# Patient Record
Sex: Male | Born: 1965 | Race: White | Hispanic: No | Smoking: Never smoker
Health system: Southern US, Community
[De-identification: ages and names within clinical notes are randomized; demographics above are authoritative.]

## PROBLEM LIST (undated history)

## (undated) DIAGNOSIS — M109 Gout, unspecified: Secondary | ICD-10-CM

## (undated) DIAGNOSIS — I4891 Unspecified atrial fibrillation: Secondary | ICD-10-CM

## (undated) DIAGNOSIS — I10 Essential (primary) hypertension: Secondary | ICD-10-CM

## (undated) HISTORY — DX: Essential (primary) hypertension: I10

## (undated) HISTORY — PX: APPENDECTOMY: SHX54

## (undated) HISTORY — DX: Unspecified atrial fibrillation: I48.91

## (undated) HISTORY — PX: HERNIA REPAIR: SHX51

---

## 2004-07-06 ENCOUNTER — Emergency Department (HOSPITAL_COMMUNITY): Admission: EM | Admit: 2004-07-06 | Discharge: 2004-07-06 | Payer: Self-pay | Admitting: Emergency Medicine

## 2004-07-16 ENCOUNTER — Ambulatory Visit (HOSPITAL_COMMUNITY): Admission: RE | Admit: 2004-07-16 | Discharge: 2004-07-16 | Payer: Self-pay

## 2005-08-21 IMAGING — CR DG CHEST 2V
2 series · 2 of 2 positions shown · non-contrast
Comparison: none

CLINICAL DATA: Preop respiratory exam for umbilical hernia surgery. 
 CHEST (TWO VIEWS) 07/13/04 
 The heart size and mediastinal contours are normal. The lungs are clear. The visualized skeleton is unremarkable.

 IMPRESSION
 No active disease.

[view not recorded (1 of 2)]
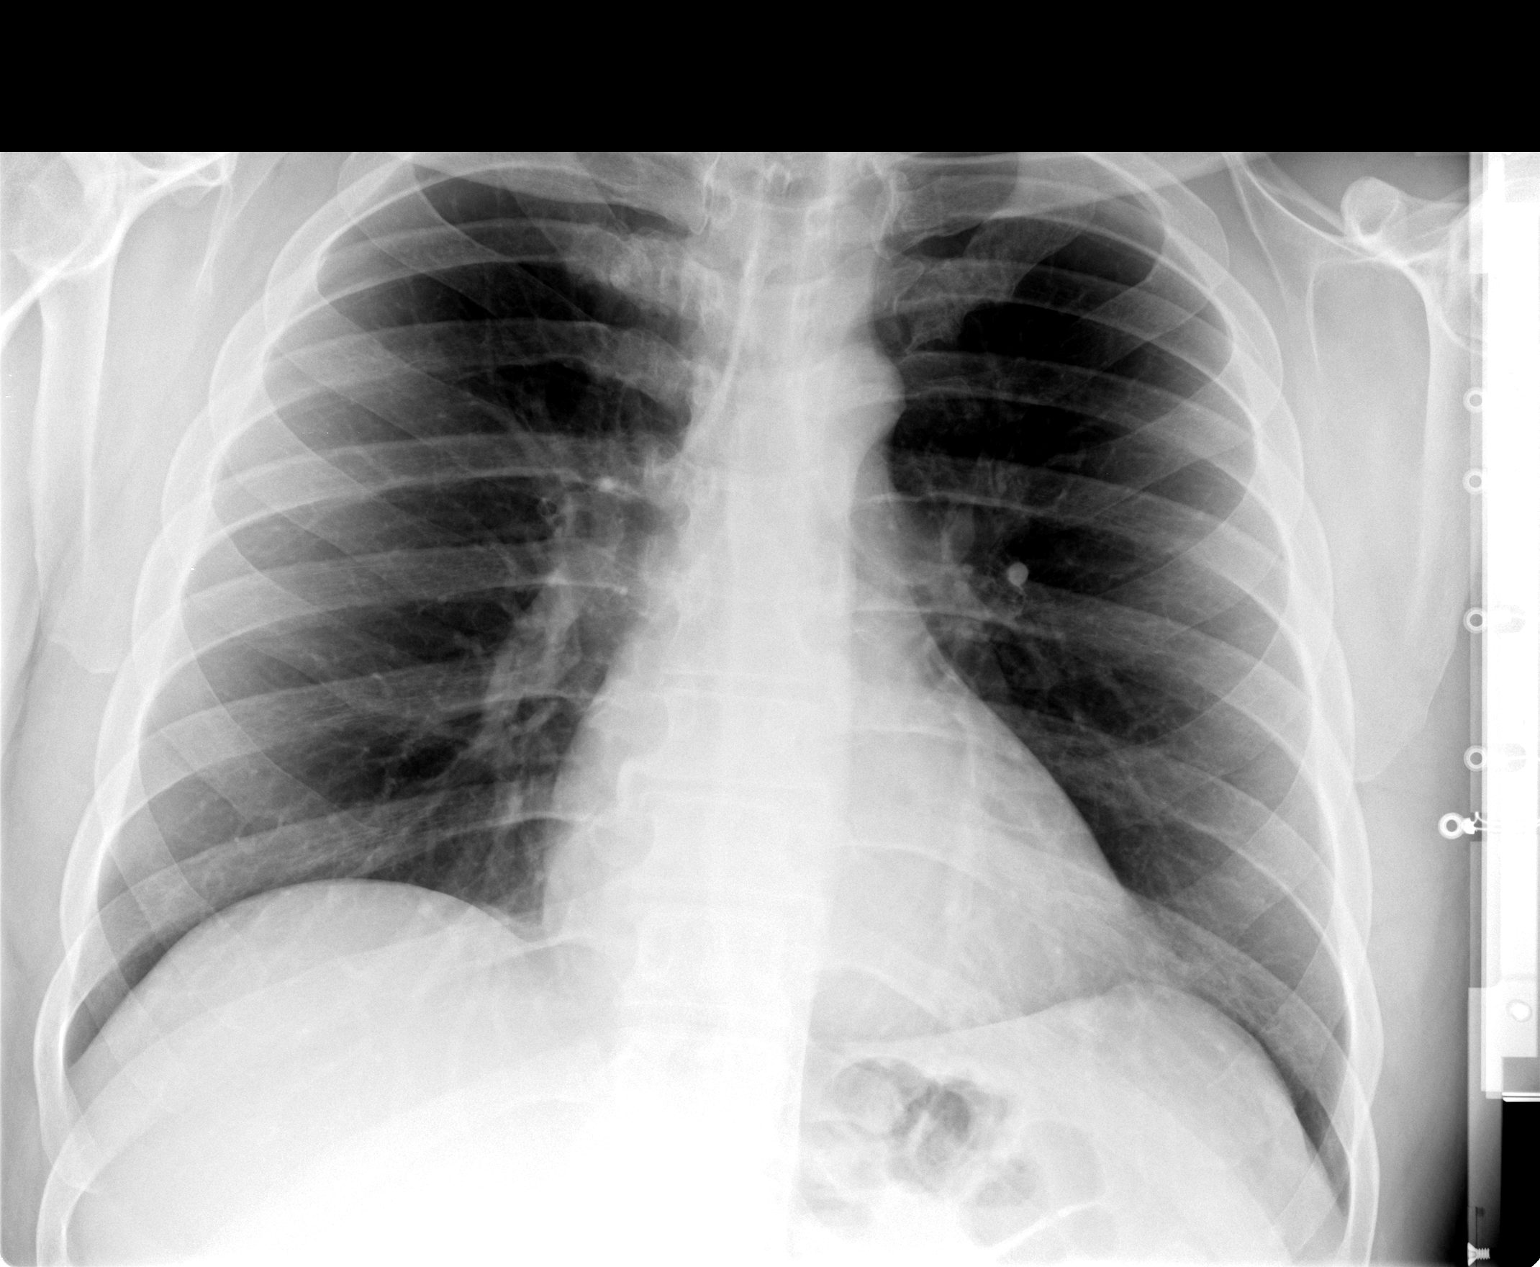

[view not recorded (2 of 2)]
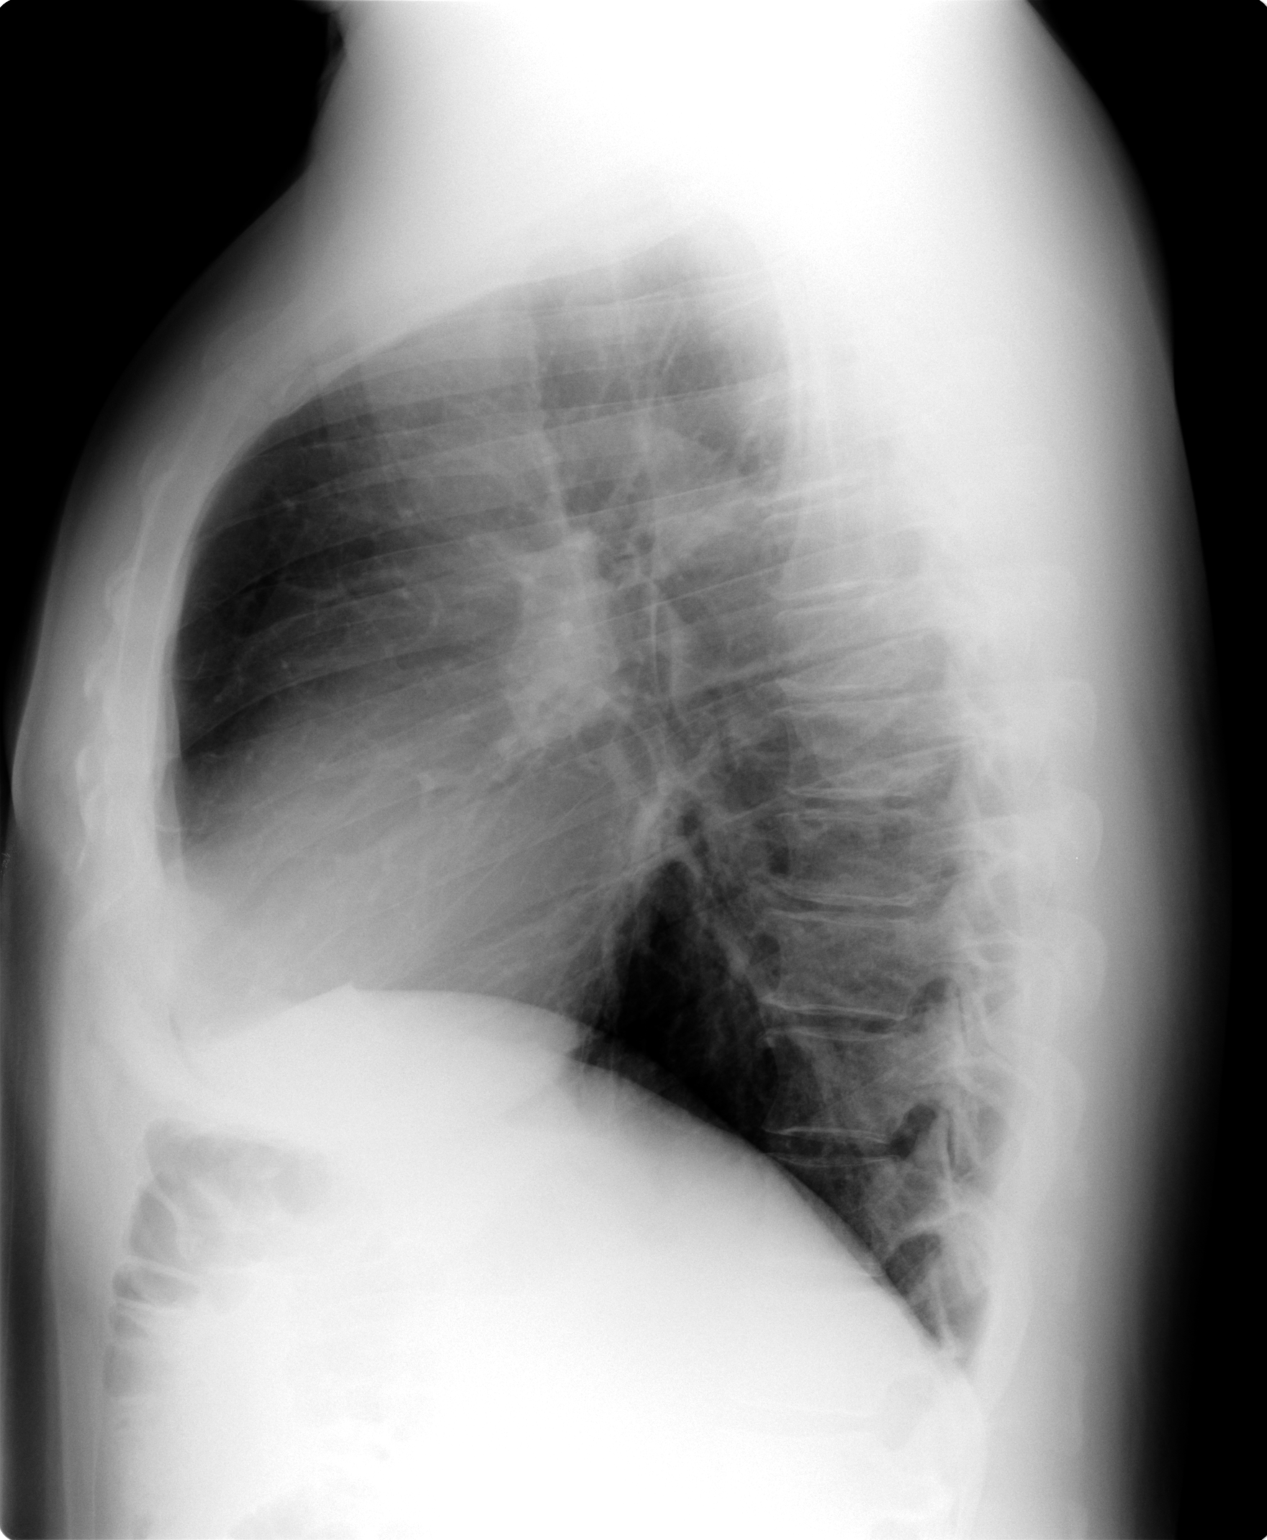

[2 of 2 positions shown; findings below may reference images not displayed]

## 2005-12-24 ENCOUNTER — Ambulatory Visit (HOSPITAL_COMMUNITY): Admission: RE | Admit: 2005-12-24 | Discharge: 2005-12-24 | Payer: Self-pay | Admitting: Orthopedic Surgery

## 2006-02-11 ENCOUNTER — Ambulatory Visit (HOSPITAL_BASED_OUTPATIENT_CLINIC_OR_DEPARTMENT_OTHER): Admission: RE | Admit: 2006-02-11 | Discharge: 2006-02-11 | Payer: Self-pay | Admitting: Orthopedic Surgery

## 2016-12-06 ENCOUNTER — Ambulatory Visit (INDEPENDENT_AMBULATORY_CARE_PROVIDER_SITE_OTHER): Payer: Self-pay | Admitting: Physician Assistant

## 2016-12-06 VITALS — BP 132/88 | HR 102 | Temp 98.7°F | Resp 16 | Ht 72.0 in | Wt 298.0 lb

## 2016-12-06 DIAGNOSIS — M109 Gout, unspecified: Secondary | ICD-10-CM | POA: Insufficient documentation

## 2016-12-06 DIAGNOSIS — M25561 Pain in right knee: Secondary | ICD-10-CM

## 2016-12-06 MED ORDER — INDOMETHACIN 50 MG PO CAPS: ORAL_CAPSULE | ORAL | 1 refills | Status: DC

## 2016-12-06 MED ORDER — ALLOPURINOL 300 MG PO TABS: 300.0000 mg | ORAL_TABLET | Freq: Every day | ORAL | 3 refills | Status: AC

## 2016-12-06 NOTE — Patient Instructions (Addendum)
Use the cane to help take some weight off the RIGHT knee. If you borrow crutches, that would help even more.    IF you received an x-ray today, you will receive an invoice from [facility]. Please contact [facility] at [phone] with questions or concerns regarding your invoice.   IF you received labwork today, you will receive an invoice from [facility]. Please contact [facility] at [phone] with questions or concerns regarding your invoice.   Our billing staff will not be able to assist you with questions regarding bills from these companies.  You will be contacted with the lab results as soon as they are available. The fastest way to get your results is to activate your My Chart account. Instructions are located on the last page of this paperwork. If you have not heard from us regarding the results in 2 weeks, please contact this office.

## 2016-12-06 NOTE — Progress Notes (Signed)
Patient ID: Craig [name], male    DOB: 18-May-1966, 51 y.o.   MRN: [id number]  PCP: No primary care provider on file. Previously saw Dr. Jacky KindleAronson, but not x 5 years  Chief Complaint  Patient presents with  . Knee Pain    right, X 4-5 weeks     Subjective:   Presents for evaluation of RIGHT knee pain.  RIGHT knee pain x 1 month. No specific trauma or injury he recalls. On 11/26/2016, he was seen at a local urgent care facility. Reportedly radiographs were negative. Prescribed #9 tablets of indomethacin. It seemed to be helping, but he didn't have an adequate supply. Feels like sharp stabbing pain laterally/medially, like something is being jabbed through the knee. No swelling, redness or increased warmth. Has tried multiple OTC oral and topical products.  History of gout. Previously took allopurinol for maintenance. On 11/26/2016, he was provided 14-day supply of allopurinol.  Pain is keeping him from sleeping well. The pain wakes him when he rolls over. Weight bearing increases the pain. This is similar to, but not as bad as gout flares in the past. Has had knee injections previously x 2, the most recent was about 10 years ago.     Review of Systems As above. No CP, SOB, HA. No urinary symptoms.    Patient Active Problem List   Diagnosis Date Noted  . Gout 12/06/2016     Prior to Admission medications   Medication Sig Start Date End Date Taking? Authorizing Provider  allopurinol (ZYLOPRIM) 300 MG tablet Take 300 mg by mouth daily.   Yes Historical Provider, MD     No Known Allergies     Objective:  Physical Exam  Constitutional: He is oriented to person, place, and time. He appears well-developed and well-nourished. He is active and cooperative. No distress.  BP 132/88 (BP Location: Right Arm, Patient Position: Standing, Cuff Size: Large)   Pulse (!) 102   Temp 98.7 F (37.1 C) (Oral)   Resp 16   Ht 6' (1.829 m)   Wt 298 lb (135.2 kg)   SpO2 98%   BMI  40.42 kg/m    Eyes: Conjunctivae are normal.  Pulmonary/Chest: Effort normal.  Musculoskeletal:       Right knee: He exhibits normal range of motion, no swelling, no effusion, no ecchymosis, no deformity, no laceration and no erythema. Tenderness found. Medial joint line and lateral joint line tenderness noted. No patellar tendon tenderness noted.       Left knee: Normal.       Right ankle: Normal.       Right upper leg: Normal.       Right lower leg: He exhibits no tenderness, no bony tenderness and no edema.  Unable to assess laxity of the knee due to pain with medial and lateral stress and McMurray's.   Neurological: He is alert and oriented to person, place, and time.  Psychiatric: He has a normal mood and affect. His speech is normal and behavior is normal.           Assessment & Plan:   1. Acute pain of right knee Suspect meniscal source of pain, though cannot exclude MCL/LCL injury. Since the indomethacin was helping, resume it. If no improvement in 2 weeks, would pursue additional evaluation and treatment. Patient does not have health insurance. - indomethacin (INDOCIN) 50 MG capsule; TAKE 1 CAPSULE BY MOUTH THREE TIMES A DAY AS NEEDED  Dispense: 60 capsule; Refill: 1  2. Gout, unspecified cause, unspecified chronicity, unspecified site No gout flare in 5 years. Stopped Allopurinol due to cost when he lost health insurance. He understands that the current knee pain is not likely to represent gout, he would like to resume Allopurinol to prevent gout attack. - allopurinol (ZYLOPRIM) 300 MG tablet; Take 1 tablet (300 mg total) by mouth daily.  Dispense: 30 tablet; Refill: 3   Craig Bras, PA-C Physician Assistant-Certified Primary Care at [facility]

## 2016-12-16 ENCOUNTER — Ambulatory Visit (INDEPENDENT_AMBULATORY_CARE_PROVIDER_SITE_OTHER): Payer: Self-pay | Admitting: Emergency Medicine

## 2016-12-16 VITALS — BP 130/92 | Temp 97.8°F | Ht 72.0 in | Wt 300.0 lb

## 2016-12-16 DIAGNOSIS — M25561 Pain in right knee: Secondary | ICD-10-CM

## 2016-12-16 DIAGNOSIS — M109 Gout, unspecified: Secondary | ICD-10-CM

## 2016-12-16 MED ORDER — HYDROCODONE-ACETAMINOPHEN 5-325 MG PO TABS: 1.0000 | ORAL_TABLET | Freq: Four times a day (QID) | ORAL | 0 refills | Status: AC | PRN

## 2016-12-16 MED ORDER — KETOROLAC TROMETHAMINE 60 MG/2ML IM SOLN
60.0000 mg | Freq: Once | INTRAMUSCULAR | Status: AC
  Administered 2016-12-16: 60 mg via INTRAMUSCULAR

## 2016-12-16 NOTE — Patient Instructions (Addendum)
   IF you received an x-ray today, you will receive an invoice from [facility]. Please contact [facility] at [phone] with questions or concerns regarding your invoice.   IF you received labwork today, you will receive an invoice from [facility]. Please contact [facility] at [phone] with questions or concerns regarding your invoice.   Our billing staff will not be able to assist you with questions regarding bills from these companies.  You will be contacted with the lab results as soon as they are available. The fastest way to get your results is to activate your My Chart account. Instructions are located on the last page of this paperwork. If you have not heard from us regarding the results in 2 weeks, please contact this office.      Gout Introduction Gout is painful swelling that can happen in some of your joints. Gout is a type of arthritis. This condition is caused by having too much uric acid in your body. Uric acid is a chemical that is made when your body breaks down substances called purines. If your body has too much uric acid, sharp crystals can form and build up in your joints. This causes pain and swelling. Gout attacks can happen quickly and be very painful (acute gout). Over time, the attacks can affect more joints and happen more often (chronic gout). Follow these instructions at home: During a Gout Attack  If directed, put ice on the painful area:  Put ice in a plastic bag.  Place a towel between your skin and the bag.  Leave the ice on for 20 minutes, 2-3 times a day.  Rest the joint as much as possible. If the joint is in your leg, you may be given crutches to use.  Raise (elevate) the painful joint above the level of your heart as often as you can.  Drink enough fluids to keep your pee (urine) clear or pale yellow.  Take over-the-counter and prescription medicines only as told by your doctor.  Do not drive or use heavy machinery  while taking prescription pain medicine.  Follow instructions from your doctor about what you can or cannot eat and drink.  Return to your normal activities as told by your doctor. Ask your doctor what activities are safe for you. Avoiding Future Gout Attacks  Follow a low-purine diet as told by a specialist (dietitian) or your doctor. Avoid foods and drinks that have a lot of purines, such as:  Liver.  Kidney.  Anchovies.  Asparagus.  Herring.  Mushrooms  Mussels.  Beer.  Limit alcohol intake to no more than 1 drink a day for nonpregnant women and 2 drinks a day for men. One drink equals 12 oz of beer, 5 oz of wine, or 1 oz of hard liquor.  Stay at a healthy weight or lose weight if you are overweight. If you want to lose weight, talk with your doctor. It is important that you do not lose weight too fast.  Start or continue an exercise plan as told by your doctor.  Drink enough fluids to keep your pee clear or pale yellow.  Take over-the-counter and prescription medicines only as told by your doctor.  Keep all follow-up visits as told by your doctor. This is important. Contact a doctor if:  You have another gout attack.  You still have symptoms of a gout attack after10 days of treatment.  You have problems (side effects) because of your medicines.  You have chills or a   fever.  You have burning pain when you pee (urinate).  You have pain in your lower back or belly. Get help right away if:  You have very bad pain.  Your pain cannot be controlled.  You cannot pee. This information is not intended to replace advice given to you by your health care provider. Make sure you discuss any questions you have with your health care provider. Document Released: 08/27/2008 Document Revised: 04/25/2016 Document Reviewed: 08/31/2015  2017 Elsevier  

## 2016-12-16 NOTE — Progress Notes (Signed)
Craig Stevenson 51 y.o.   Chief Complaint  Patient presents with  . Knee Pain    X 3 weeks    HISTORY OF PRESENT ILLNESS: This is a 51 y.o. male complaining of pain to right knee x 2-3 weeks. Denies injury or any other significant symptoms.  Knee Pain   The incident occurred more than 1 week ago. Incident location: no injuries. There was no injury mechanism. The pain is present in the right knee. The quality of the pain is described as aching. The pain is at a severity of 7/10. The pain is moderate. The pain has been constant since onset. Pertinent negatives include no loss of motion, muscle weakness, numbness or tingling. The symptoms are aggravated by movement and weight bearing. He has tried NSAIDs for the symptoms. The treatment provided mild relief.     Prior to Admission medications   Medication Sig Start Date End Date Taking? Authorizing Provider  allopurinol (ZYLOPRIM) 300 MG tablet Take 1 tablet (300 mg total) by mouth daily. 12/06/16  Yes Chelle Jeffery, PA-C  indomethacin (INDOCIN) 50 MG capsule TAKE 1 CAPSULE BY MOUTH THREE TIMES A DAY AS NEEDED 12/06/16  Yes Chelle Jeffery, PA-C  HYDROcodone-acetaminophen (NORCO) 5-325 MG tablet Take 1 tablet by mouth every 6 (six) hours as needed for moderate pain. 12/16/16   Vennie Waymire Victorino December, MD    No Known Allergies  Patient Active Problem List   Diagnosis Date Noted  . Gout 12/06/2016    Past Medical History:  Diagnosis Date  . A-fib (HCC)   . Hypertension     Past Surgical History:  Procedure Laterality Date  . APPENDECTOMY    . HERNIA REPAIR      Social History   Social History  . Marital status: Divorced    Spouse name: N/A  . Number of children: N/A  . Years of education: N/A   Occupational History  . Not on file.   Social History Main Topics  . Smoking status: Never Smoker  . Smokeless tobacco: Never Used  . Alcohol use No  . Drug use: No  . Sexual activity: Not on file   Other Topics Concern  . Not on  file   Social History Narrative  . No narrative on file    Family History  Problem Relation Age of Onset  . Hypertension Mother   . Cancer Father      Review of Systems  Constitutional: Negative for chills and fever.  HENT: Negative.   Eyes: Negative for discharge and redness.  Respiratory: Negative for cough and shortness of breath.   Cardiovascular: Negative for chest pain and palpitations.  Gastrointestinal: Negative for nausea and vomiting.  Genitourinary: Negative.   Musculoskeletal: Positive for joint pain (right knee).  Skin: Negative.   Neurological: Negative.  Negative for tingling and numbness.  Endo/Heme/Allergies: Negative.   Psychiatric/Behavioral: Negative.     Vitals:   12/16/16 1319  BP: (!) 130/92  Temp: 97.8 F (36.6 C)    Physical Exam  Constitutional: He is oriented to person, place, and time. He appears well-developed and well-nourished.  HENT:  Head: Normocephalic and atraumatic.  Nose: Nose normal.  Mouth/Throat: Oropharynx is clear and moist.  Eyes: Conjunctivae and EOM are normal. Pupils are equal, round, and reactive to light.  Neck: Normal range of motion. Neck supple.  Cardiovascular: Normal rate, regular rhythm, normal heart sounds and intact distal pulses.   Pulmonary/Chest: Effort normal and breath sounds normal.  Abdominal: Soft. Bowel sounds are  normal. He exhibits no distension. There is no tenderness.  Musculoskeletal:  Right knee: +anterior tenderness with mild swelling, FROM but c/o pain LE : NVI  Neurological: He is alert and oriented to person, place, and time. He displays normal reflexes. No sensory deficit. He exhibits normal muscle tone.  Skin: Skin is warm and dry. Capillary refill takes less than 2 seconds.  Psychiatric: He has a normal mood and affect. His behavior is normal.  Nursing note and vitals reviewed.    ASSESSMENT & PLAN: Mance was seen today for knee pain.  Diagnoses and all orders for this  visit:  Acute gout of right knee, unspecified cause -     ketorolac (TORADOL) injection 60 mg; Inject 2 mLs (60 mg total) into the muscle once.  Other orders -     HYDROcodone-acetaminophen (NORCO) 5-325 MG tablet; Take 1 tablet by mouth every 6 (six) hours as needed for moderate pain.    Patient Instructions       IF you received an x-ray today, you will receive an invoice from [facility]. Please contact [facility] at [phone] with questions or concerns regarding your invoice.   IF you received labwork today, you will receive an invoice from [facility]. Please contact [facility] at [phone] with questions or concerns regarding your invoice.   Our billing staff will not be able to assist you with questions regarding bills from these companies.  You will be contacted with the lab results as soon as they are available. The fastest way to get your results is to activate your My Chart account. Instructions are located on the last page of this paperwork. If you have not heard from [place] regarding the results in 2 weeks, please contact this office.      Gout Introduction Gout is painful swelling that can happen in some of your joints. Gout is a type of arthritis. This condition is caused by having too much uric acid in your body. Uric acid is a chemical that is made when your body breaks down substances called purines. If your body has too much uric acid, sharp crystals can form and build up in your joints. This causes pain and swelling. Gout attacks can happen quickly and be very painful (acute gout). Over time, the attacks can affect more joints and happen more often (chronic gout). Follow these instructions at home: During a Gout Attack  If directed, put ice on the painful area:  Put ice in a plastic bag.  Place a towel between your skin and the bag.  Leave the ice on for 20 minutes, 2-3 times a day.  Rest the joint as much as possible. If the joint is in  your leg, you may be given crutches to use.  Raise (elevate) the painful joint above the level of your heart as often as you can.  Drink enough fluids to keep your pee (urine) clear or pale yellow.  Take over-the-counter and prescription medicines only as told by your doctor.  Do not drive or use heavy machinery while taking prescription pain medicine.  Follow instructions from your doctor about what you can or cannot eat and drink.  Return to your normal activities as told by your doctor. Ask your doctor what activities are safe for you. Avoiding Future Gout Attacks  Follow a low-purine diet as told by a specialist (dietitian) or your doctor. Avoid foods and drinks that have a lot of purines, such as:  Liver.  Kidney.  Anchovies.  Asparagus.  Herring.  Mushrooms  Mussels.  Beer.  Limit alcohol intake to no more than 1 drink a day for nonpregnant women and 2 drinks a day for men. One drink equals 12 oz of beer, 5 oz of wine, or 1 oz of hard liquor.  Stay at a healthy weight or lose weight if you are overweight. If you want to lose weight, talk with your doctor. It is important that you do not lose weight too fast.  Start or continue an exercise plan as told by your doctor.  Drink enough fluids to keep your pee clear or pale yellow.  Take over-the-counter and prescription medicines only as told by your doctor.  Keep all follow-up visits as told by your doctor. This is important. Contact a doctor if:  You have another gout attack.  You still have symptoms of a gout attack after10 days of treatment.  You have problems (side effects) because of your medicines.  You have chills or a fever.  You have burning pain when you pee (urinate).  You have pain in your lower back or belly. Get help right away if:  You have very bad pain.  Your pain cannot be controlled.  You cannot pee. This information is not intended to replace advice given to you by your health  care provider. Make sure you discuss any questions you have with your health care provider. Document Released: 08/27/2008 Document Revised: 04/25/2016 Document Reviewed: 08/31/2015  2017 Elsevier      Edwina BarthMiguel Giani Betzold, MD [facility]

## 2017-02-01 ENCOUNTER — Other Ambulatory Visit: Payer: Self-pay | Admitting: Physician Assistant

## 2017-02-01 DIAGNOSIS — M25561 Pain in right knee: Secondary | ICD-10-CM

## 2017-02-01 NOTE — Telephone Encounter (Signed)
12/06/16 last ov and refill x2

## 2021-06-23 ENCOUNTER — Ambulatory Visit: Admission: EM | Admit: 2021-06-23 | Discharge: 2021-06-23 | Discharge status: Against Medical Advice | Payer: Self-pay

## 2021-06-25 ENCOUNTER — Ambulatory Visit
Admission: EM | Admit: 2021-06-25 | Discharge: 2021-06-25 | Disposition: A | Discharge status: Home / Self Care | Payer: Self-pay | Attending: Family Medicine | Admitting: Family Medicine

## 2021-06-25 DIAGNOSIS — M25532 Pain in left wrist: Secondary | ICD-10-CM

## 2021-06-25 DIAGNOSIS — M25561 Pain in right knee: Secondary | ICD-10-CM

## 2021-06-25 DIAGNOSIS — R030 Elevated blood-pressure reading, without diagnosis of hypertension: Secondary | ICD-10-CM

## 2021-06-25 HISTORY — DX: Gout, unspecified: M10.9

## 2021-06-25 MED ORDER — PREDNISONE 20 MG PO TABS: 40.0000 mg | ORAL_TABLET | Freq: Every day | ORAL | 0 refills | Status: AC

## 2021-06-25 MED ORDER — INDOMETHACIN 50 MG PO CAPS: 50.0000 mg | ORAL_CAPSULE | Freq: Two times a day (BID) | ORAL | 0 refills | Status: AC | PRN

## 2021-06-25 NOTE — ED Triage Notes (Signed)
Pt c/o lt wrist pain/swelling/redness and heat since Friday. Hx of gout. States swelling and redness has got better.

## 2021-06-25 NOTE — ED Provider Notes (Signed)
[facility]    CSN: [id number] Arrival date & time: 06/25/21  0843      History   Chief Complaint Chief Complaint  Patient presents with   Wrist Pain    HPI Craig Stevenson is a 55 y.o. male.   Patient presenting today with 4-day history of left wrist pain, swelling, redness, warmth with no known injury.  He denies fever, chills, skin injury, other joint issues, numbness, tingling.  Has been drinking significant amounts of tart cherry juice in case this is a gout flare as he ran out of his indomethacin at home.  History of gout but states he has not had a flare in years, used to take allopurinol regularly but has not had a prescription since 2018.  Has taken about 6 of the pills that he had leftover but feels that they are probably expired and ineffective.  Does note that the symptoms improved after taking the tart cherry juice and his pain is now down from about an 8 out of 10 to a 4 out of 10.   Past Medical History:  Diagnosis Date   A-fib [facility])    Gout    Hypertension     Patient Active Problem List   Diagnosis Date Noted   Gout 12/06/2016    Past Surgical History:  Procedure Laterality Date   APPENDECTOMY     HERNIA REPAIR         Home Medications    Prior to Admission medications   Medication Sig Start Date End Date Taking? Authorizing Provider  predniSONE (DELTASONE) 20 MG tablet Take 2 tablets (40 mg total) by mouth daily with breakfast. 06/25/21  Yes Particia Nearing, PA-C  allopurinol (ZYLOPRIM) 300 MG tablet Take 1 tablet (300 mg total) by mouth daily. 12/06/16   Porfirio Oar, PA  HYDROcodone-acetaminophen (NORCO) 5-325 MG tablet Take 1 tablet by mouth every 6 (six) hours as needed for moderate pain. 12/16/16   Georgina Quint, MD  indomethacin (INDOCIN) 50 MG capsule Take 1 capsule (50 mg total) by mouth 2 (two) times daily as needed. 06/25/21   Particia Nearing, PA-C    Family History Family History  Problem Relation Age of  Onset   Hypertension Mother    Cancer Father     Social History Social History   Tobacco Use   Smoking status: Never   Smokeless tobacco: Never  Substance Use Topics   Alcohol use: Yes   Drug use: No     Allergies   Patient has no known allergies.   Review of Systems Review of Systems Per HPI  Physical Exam Triage Vital Signs ED Triage Vitals  Enc Vitals Group     BP      Pulse      Resp      Temp      Temp src      SpO2      Weight      Height      Head Circumference      Peak Flow      Pain Score      Pain Loc      Pain Edu?      Excl. in GC?    No data found.  Updated Vital Signs BP (!) 173/100 (BP Location: Left Arm)   Pulse 73   Temp 98.8 F (37.1 C) (Oral)   Resp 18   SpO2 96%   Visual Acuity Right Eye Distance:   Left Eye Distance:  Bilateral Distance:    Right Eye Near:   Left Eye Near:    Bilateral Near:     Physical Exam Vitals and nursing note reviewed.  Constitutional:      Appearance: Normal appearance.  HENT:     Head: Atraumatic.  Eyes:     Extraocular Movements: Extraocular movements intact.     Conjunctiva/sclera: Conjunctivae normal.  Cardiovascular:     Rate and Rhythm: Normal rate and regular rhythm.  Pulmonary:     Effort: Pulmonary effort is normal.     Breath sounds: Normal breath sounds.  Musculoskeletal:        General: Swelling and tenderness present. No deformity or signs of injury. Normal range of motion.     Cervical back: Normal range of motion and neck supple.  Skin:    General: Skin is warm and dry.     Findings: Erythema present.     Comments: Warmth present in the area of erythema left ulnar aspect of wrist tender to palpation over this region  Neurological:     General: No focal deficit present.     Mental Status: He is oriented to person, place, and time.     Comments: Left upper extremity neurovascularly intact  Psychiatric:        Mood and Affect: Mood normal.        Thought Content:  Thought content normal.        Judgment: Judgment normal.   UC Treatments / Results  Labs (all labs ordered are listed, but only abnormal results are displayed) Labs Reviewed - No data to display  EKG   Radiology No results found.  Procedures Procedures (including critical care time)  Medications Ordered in UC Medications - No data to display  Initial Impression / Assessment and Plan / UC Course  I have reviewed the triage vital signs and the nursing notes.  Pertinent labs & imaging results that were available during my care of the patient were reviewed by me and considered in my medical decision making (see chart for details).     Given presentation and lack of known injury, suspect gout flare causing his symptoms.  Likely improving without medication due to the use of tart cherry juice.  Will refill his indomethacin to keep at home on hand and treat current flare with short burst of prednisone.  Discussed diet changes for preventative purposes and close PCP follow-up for recheck and follow-up labs.  Also reviewed with patient that his blood pressure is significantly elevated today.  He is denying any headache, visual changes, dizziness, chest pain, shortness of breath.  Discussed DASH diet and close monitoring of blood pressures at home, close PCP follow-up to recheck on this as it can cause detrimental issues if not becoming better controlled over time.  He is agreeable to this and will make an appointment as soon as possible.  To go to the emergency department if becoming symptomatic as discussed.  Final Clinical Impressions(s) / UC Diagnoses   Final diagnoses:  Left wrist pain  Elevated blood pressure reading  Acute pain of right knee   Discharge Instructions   None    ED Prescriptions     Medication Sig Dispense Auth. Provider   indomethacin (INDOCIN) 50 MG capsule Take 1 capsule (50 mg total) by mouth 2 (two) times daily as needed. 10 capsule Particia Nearing,  PA-C   predniSONE (DELTASONE) 20 MG tablet Take 2 tablets (40 mg total) by mouth daily with breakfast. 6 tablet  Particia Nearing, [place]      PDMP not reviewed this encounter.   Particia Nearing, [place] 06/25/21 [phone]
